# Patient Record
Sex: Male | Born: 1960 | Race: White | Hispanic: No | Marital: Single | Smoking: Current every day smoker
Health system: Southern US, Community
[De-identification: ages and names within clinical notes are randomized; demographics above are authoritative.]

## PROBLEM LIST (undated history)

## (undated) DIAGNOSIS — Z9889 Other specified postprocedural states: Secondary | ICD-10-CM

## (undated) DIAGNOSIS — F79 Unspecified intellectual disabilities: Secondary | ICD-10-CM

## (undated) DIAGNOSIS — I10 Essential (primary) hypertension: Secondary | ICD-10-CM

## (undated) DIAGNOSIS — E785 Hyperlipidemia, unspecified: Secondary | ICD-10-CM

## (undated) DIAGNOSIS — K5909 Other constipation: Secondary | ICD-10-CM

## (undated) DIAGNOSIS — Z789 Other specified health status: Secondary | ICD-10-CM

## (undated) HISTORY — DX: Unspecified intellectual disabilities: F79

## (undated) HISTORY — DX: Hyperlipidemia, unspecified: E78.5

## (undated) HISTORY — DX: Other constipation: K59.09

## (undated) HISTORY — DX: Essential (primary) hypertension: I10

## (undated) HISTORY — DX: Other specified postprocedural states: Z98.890

---

## 1997-10-25 DIAGNOSIS — Z9889 Other specified postprocedural states: Secondary | ICD-10-CM

## 1997-10-25 HISTORY — DX: Other specified postprocedural states: Z98.890

## 2003-11-22 ENCOUNTER — Encounter: Admission: RE | Admit: 2003-11-22 | Discharge: 2003-11-22 | Payer: Self-pay | Admitting: Internal Medicine

## 2006-04-01 ENCOUNTER — Ambulatory Visit: Payer: Self-pay | Admitting: Internal Medicine

## 2008-04-23 ENCOUNTER — Ambulatory Visit: Payer: Self-pay | Admitting: Internal Medicine

## 2009-06-09 ENCOUNTER — Encounter (INDEPENDENT_AMBULATORY_CARE_PROVIDER_SITE_OTHER): Payer: Self-pay | Admitting: *Deleted

## 2009-08-05 DIAGNOSIS — I10 Essential (primary) hypertension: Secondary | ICD-10-CM | POA: Insufficient documentation

## 2009-08-05 DIAGNOSIS — E7800 Pure hypercholesterolemia, unspecified: Secondary | ICD-10-CM

## 2009-08-05 DIAGNOSIS — F79 Unspecified intellectual disabilities: Secondary | ICD-10-CM

## 2009-08-05 DIAGNOSIS — K5909 Other constipation: Secondary | ICD-10-CM | POA: Insufficient documentation

## 2009-08-08 ENCOUNTER — Ambulatory Visit: Payer: Self-pay | Admitting: Gastroenterology

## 2009-08-09 ENCOUNTER — Encounter: Payer: Self-pay | Admitting: Internal Medicine

## 2009-08-23 ENCOUNTER — Encounter: Payer: Self-pay | Admitting: Gastroenterology

## 2009-09-02 ENCOUNTER — Ambulatory Visit: Payer: Self-pay | Admitting: Internal Medicine

## 2009-09-02 ENCOUNTER — Ambulatory Visit (HOSPITAL_COMMUNITY): Admission: RE | Admit: 2009-09-02 | Discharge: 2009-09-02 | Payer: Self-pay | Admitting: Internal Medicine

## 2009-09-02 HISTORY — PX: COLONOSCOPY: SHX174

## 2009-09-07 ENCOUNTER — Encounter: Payer: Self-pay | Admitting: Internal Medicine

## 2010-08-10 ENCOUNTER — Encounter (INDEPENDENT_AMBULATORY_CARE_PROVIDER_SITE_OTHER): Payer: Self-pay | Admitting: *Deleted

## 2010-09-26 NOTE — Letter (Signed)
Summary: Patient Notice, Colon Biopsy Results  [facility]  [address]   [place], [place] [address]   Phone: [phone]  Fax: [phone]       September 07, 2009   Brandon Wilcox [address] [place], [place]  [address] [birth year]    Dear Mr. Scarpati,  I am pleased to inform you that the biopsies taken during your recent colonoscopy did not show any evidence of cancer upon pathologic examination.  Additional information/recommendations:  You should have a repeat colonoscopy examination  in 5 years.  Please call us if you are having persistent problems or have questions about your condition that have not been fully answered at this time.  Sincerely,    R. Roetta Sessions MD  [facility] Ph: [phone]    Fax: [phone]   Appended Document: Patient Notice, Colon Biopsy Results mailed letter to pt

## 2010-09-28 NOTE — Letter (Signed)
Summary: Recall Office Visit  [facility]  [address]   [place], [place] [address]   Phone: [phone]  Fax: [phone]      August 10, 2010   Brandon Wilcox [address] [place], [place]  [address] 1960/10/19   Dear [name],   According to our records, it is time for you to schedule a follow-up office visit with [place].   At your convenience, please call [phone] to schedule an office visit. If you have any questions, concerns, or feel that this letter is in error, we would appreciate your call.   Sincerely,    Diana Eves  [facility] Ph: [phone]   Fax: [phone]

## 2010-10-04 ENCOUNTER — Ambulatory Visit: Payer: Self-pay | Admitting: Gastroenterology

## 2010-10-23 ENCOUNTER — Encounter: Payer: Self-pay | Admitting: Gastroenterology

## 2010-10-23 ENCOUNTER — Ambulatory Visit (INDEPENDENT_AMBULATORY_CARE_PROVIDER_SITE_OTHER): Payer: Medicare Other | Admitting: Gastroenterology

## 2010-10-23 DIAGNOSIS — K5909 Other constipation: Secondary | ICD-10-CM

## 2010-11-02 NOTE — Assessment & Plan Note (Signed)
Summary: yearly follow up   Vital Signs:  Patient profile:   50 year old male Height:      71 inches Weight:      224 pounds BMI:     31.35 Temp:     98.1 degrees F oral Pulse rate:   72 / minute BP sitting:   120 / 82  (left arm)  Vitals Entered By: Carolan Clines LPN (October 23, 2010 9:34 AM)  Visit Type:  Follow-up Visit Primary Care Provider:  Virgina Organ   History of Present Illness: [name] is a 50 year old Caucasian male, mentally challenged, here with caregiver today. Has hx of chronic constipation. Last colonoscopy in Jan 2011, tubular ademona. Due for repeat in 2015. Denies abdominal pain. Caregiver unsure how often has a BM. No melena or hematochezia. Denies nausea. Caregiver reports good appetite. Wt 224 today, up about 6 lbs from a visit one year ago.   Current Medications (verified): 1)  Folic Acid 1 Mg Tabs (Folic Acid) .... Once Daily 2)  Seroquel Xr 300 Mg Xr24h-Tab (Quetiapine Fumarate) .... Take 2 At Bedtime 3)  Certagen  Tabs (Multiple Vitamins-Minerals) .... Once Daily 4)  Niaspan 500 Mg Cr-Tabs (Niacin (Antihyperlipidemic)) .... Take 3 At Bedtime 5)  Propranolol Hcl 40 Mg Tabs (Propranolol Hcl) .... Four Times A Day 6)  Lactulose 10 Gm/71ml Soln (Lactulose) .... 30 Cc Two Times A Day 7)  Selenium 2.5% Lotion/ Shampoo .... Twice Weekly 8)  Calcium Antacid Tablet .... Take 1 Tablet By Mouth Two Times A Day 9)  Lamictal 100 Mg Tabs (Lamotrigine) .... Take 1 Tablet By Mouth Two Times A Day 10)  Naftin 1 % Gel (Naftifine Hcl) .... Apply To Feet and Inbetween Toes  Once Daily X 4 Weeks 11)  Polyethylene Glycol 3350  Powd (Polyethylene Glycol 3350) .Marland KitchenMarland KitchenMarland Kitchen 17 Grams By Mouth Daily  Allergies: No Known Drug Allergies  Past History:  Past Medical History: Last updated: 08/08/2009 Hyperlipidemia Hypertension Chronic constipation Mental retardation Flex sig, 3/99, normal to 60cm.  ACBE normal.  Past Surgical History: Last updated: 08/05/2009 NONE  Family  History: Last updated: 08/08/2009 No known family history of colon cancer.  Social History: Last updated: 08/08/2009 Single. No children. Resident of [facility]. Patient currently smokes.  Alcohol Use - no  Review of Systems       unable to obtain complete report from pt. hx mentally challenged. However, denies abdominal pain, N/V.   Physical Exam  General:  Well developed, well nourished, no acute distress. Head:  Normocephalic and atraumatic. Eyes:  sclera without icterus Lungs:  Clear throughout to auscultation. Heart:  Regular rate and rhythm; no murmurs, rubs,  or bruits. Abdomen:  +BS, obese, soft, non-tender, non-distended. Pt very ticklish.  Msk:  Symmetrical with no gross deformities. Normal posture. Extremities:  No clubbing, cyanosis, edema or deformities noted. Neurologic:  alert to person. hx mentally challenged   Impression & Recommendations:  Problem # 1:  CONSTIPATION, CHRONIC (ICD-41.9)  50 year old Caucasian male, resident of [facility], with hx of chronic constipation. Denies abd pain, N/V. no brbpr. No loss of appetite, wt actually up about 6 lbs from last visit. Due for repeat colonoscopy in 2015. Taking lactulose 30 mg twice/daily and miralax daily.  Continue lactulose and miralax Contact group home to find out how often having BM Return in 1 year colonoscopy in 2015.   Orders: Est. Patient Level II (57[address])   Orders Added: 1)  Est. Patient Level II [[address]]  Appended Document:  yearly follow up can we contact [facility] at [phone] and find out how often pt has BM? caregiver unsure today.  Appended Document: yearly follow up 1 YR F/U OPV IS IN THE COMPUTER  Appended Document: yearly follow up called [facility] got voicemail- left message  Appended Document: yearly follow up [facility]- she stated pt will usually go 2-3 days before he has bm and sometimes they have to give him MOM or prune juice to help  him go. In the past when he lived at home- his father told him when he could go to the bathroom so pt learned to hold it for days.

## 2011-01-09 NOTE — Assessment & Plan Note (Signed)
NAMEMarland Kitchen  Brandon, Wilcox             CHART#:  [id number]   DATE:  04/23/2008                       DOB:  1960/12/12   FOLLOWUP:  Constipation, last seen on 04/01/2006.  The patient is a  mentally challenged pleasant gentleman, resident of [facility].  He was last seen here 2 years ago.  He has chronic constipation.  He has  done very well on lactulose 30 mL b.i.d.  Generally, he has a bowel  movement daily.  His caregiver accompanies him today states that he on a  rare occasion, needs milk of magnesia (i.e. on the order of 2 times  every 6 months to facilitate bowel function.  He does not have any  melena or rectal bleeding.  No abdominal pain or upper GI tract  symptoms.  No odynophagia, dysphagia, or nausea or vomiting.  He has  lost 19 pounds since he was last seen in 2007.  He is followed primarily  by Dr. Dyann Ruddle.   CURRENT MEDICATIONS:  See updated list.   ALLERGIES:  No known drug allergies.   PHYSICAL EXAMINATION:  VITAL SIGNS:  Today, he appears at his baseline.  Weight 200, height 6 feet, temperature 97.8, BP 102/68, and pulse 64.  SKIN:  Warm and dry.  CHEST:  Lungs are clear to auscultation.  CARDIAC:  Regular rate and rhythm without murmur, gallop, or rub.  ABDOMEN:  Nondistended, positive bowel sounds, entirely soft, and  nontender without appreciable mass or hepatosplenomegaly.   ASSESSMENT:  Chronic constipation well managed with lactulose.  He is  tolerating this regimen very well.  He will be due for his first ever  screening colonoscopy.  He is in average risk population in 1 year.  We  will plan to see this gentleman back here in the office in 1 year and  set him for screening colonoscopy.       Brandon Wilcox, M.D.  Electronically Signed     RMR/MEDQ  D:  04/23/2008  T:  04/23/2008  Job:  [id number]   cc:   [place]

## 2012-03-27 ENCOUNTER — Encounter (HOSPITAL_COMMUNITY): Payer: Self-pay

## 2012-03-27 ENCOUNTER — Emergency Department (HOSPITAL_COMMUNITY)
Admission: EM | Admit: 2012-03-27 | Discharge: 2012-03-27 | Disposition: A | Discharge status: Home / Self Care | Payer: Medicare Other | Attending: Emergency Medicine | Admitting: Emergency Medicine

## 2012-03-27 ENCOUNTER — Emergency Department (HOSPITAL_COMMUNITY): Payer: Medicare Other

## 2012-03-27 DIAGNOSIS — E785 Hyperlipidemia, unspecified: Secondary | ICD-10-CM | POA: Insufficient documentation

## 2012-03-27 DIAGNOSIS — J40 Bronchitis, not specified as acute or chronic: Secondary | ICD-10-CM | POA: Insufficient documentation

## 2012-03-27 DIAGNOSIS — J069 Acute upper respiratory infection, unspecified: Secondary | ICD-10-CM | POA: Insufficient documentation

## 2012-03-27 DIAGNOSIS — F79 Unspecified intellectual disabilities: Secondary | ICD-10-CM | POA: Insufficient documentation

## 2012-03-27 DIAGNOSIS — I10 Essential (primary) hypertension: Secondary | ICD-10-CM | POA: Insufficient documentation

## 2012-03-27 DIAGNOSIS — F17200 Nicotine dependence, unspecified, uncomplicated: Secondary | ICD-10-CM | POA: Insufficient documentation

## 2012-03-27 MED ORDER — HYDROCOD POLST-CHLORPHEN POLST 10-8 MG/5ML PO LQCR
5.0000 mL | Freq: Once | ORAL | Status: AC
  Administered 2012-03-27: 5 mL via ORAL
  Filled 2012-03-27: qty 5

## 2012-03-27 MED ORDER — AZITHROMYCIN 250 MG PO TABS: ORAL_TABLET | ORAL | Status: DC

## 2012-03-27 MED ORDER — DOXYCYCLINE HYCLATE 100 MG PO TABS
100.0000 mg | ORAL_TABLET | Freq: Once | ORAL | Status: AC
  Administered 2012-03-27: 100 mg via ORAL
  Filled 2012-03-27: qty 1

## 2012-03-27 MED ORDER — HYDROCOD POLST-CHLORPHEN POLST 10-8 MG/5ML PO LQCR: 5.0000 mL | Freq: Two times a day (BID) | ORAL | Status: DC | PRN

## 2012-03-27 NOTE — ED Provider Notes (Signed)
History     CSN: [id number]  Arrival date & time 03/27/12  1016   First MD Initiated Contact with Patient 03/27/12 1026      Chief Complaint  Patient presents with  . Cough    (Consider location/radiation/quality/duration/timing/severity/associated sxs/prior treatment) Patient is a 51 y.o. male presenting with cough. The history is provided by a caregiver.  Cough This is a new problem. The current episode started more than 2 days ago. The problem occurs every few minutes. The problem has been gradually worsening. The cough is non-productive. There has been no fever. Associated symptoms include rhinorrhea. Pertinent negatives include no chest pain, no shortness of breath and no wheezing. He has tried cough syrup for the symptoms. The treatment provided no relief. He is a smoker. His past medical history is significant for bronchitis. His past medical history does not include pneumonia. Past medical history comments: Mental retardation.    Past Medical History  Diagnosis Date  . Hyperlipidemia   . Hypertension   . Chronic constipation   . Mental retardation   . History of flexible sigmoidoscopy 3/99    Normal  to 60 cm ACBE normal    History reviewed. No pertinent past surgical history.  No family history on file.  History  Substance Use Topics  . Smoking status: Current Everyday Smoker  . Smokeless tobacco: Not on file  . Alcohol Use: No      Review of Systems  Constitutional: Negative for activity change.       All ROS Neg except as noted in HPI  HENT: Positive for rhinorrhea. Negative for nosebleeds and neck pain.   Eyes: Negative for photophobia and discharge.  Respiratory: Positive for cough. Negative for shortness of breath and wheezing.   Cardiovascular: Negative for chest pain and palpitations.  Gastrointestinal: Negative for abdominal pain and blood in stool.  Genitourinary: Negative for dysuria, frequency and hematuria.  Musculoskeletal: Negative for back  pain and arthralgias.  Skin: Negative.   Neurological: Negative for dizziness, seizures and speech difficulty.  Psychiatric/Behavioral: Negative for hallucinations and confusion.    Allergies  Review of patient's allergies indicates no known allergies.  Home Medications   Current Outpatient Rx  Name Route Sig Dispense Refill  . NIACIN ER (ANTIHYPERLIPIDEMIC) 500 MG PO TBCR Oral Take 1,500 mg by mouth at bedtime.    Marland Kitchen POLYETHYLENE GLYCOL 3350 PO PACK Oral Take 17 g by mouth daily.    . AZITHROMYCIN 250 MG PO TABS  2 tablets on 8/2, then 1 po daily until all taken. 6 tablet 0  . CALCIUM ANTACID PO Oral Take by mouth.      Marland Kitchen HYDROCOD POLST-CPM POLST ER 10-8 MG/5ML PO LQCR Oral Take 5 mLs by mouth every 12 (twelve) hours as needed. 100 mL 0  . FOLIC ACID 1 MG PO TABS Oral Take 1 mg by mouth daily.      Marland Kitchen LACTULOSE 10 GM/15ML PO SOLN Oral Take 20 g by mouth 2 (two) times daily.     Marland Kitchen LAMOTRIGINE 100 MG PO TABS Oral Take 100 mg by mouth 2 (two) times daily.     . CERTAGEN PO Oral Take by mouth.      Marland Kitchen PROPRANOLOL HCL 40 MG PO TABS Oral Take 40 mg by mouth 4 (four) times daily.     . QUETIAPINE FUMARATE ER 300 MG PO TB24 Oral Take 600 mg by mouth at bedtime.     . SELENIUM SULFIDE 2.5 % EX LOTN Topical Apply 1  application topically daily as needed. itching      BP 121/83  Pulse 66  Temp 97.8 F (36.6 C) (Oral)  Resp 18  Ht 6' (1.829 m)  Wt 210 lb (95.255 kg)  BMI 28.48 kg/m2  SpO2 98%  Physical Exam  Nursing note and vitals reviewed. Constitutional: He is oriented to person, place, and time. He appears well-developed and well-nourished.  Non-toxic appearance.  HENT:  Head: Normocephalic.  Right Ear: Tympanic membrane and external ear normal.  Left Ear: Tympanic membrane and external ear normal.  Eyes: EOM and lids are normal. Pupils are equal, round, and reactive to light.  Neck: Normal range of motion. Neck supple. Carotid bruit is not present.  Cardiovascular: Normal rate,  regular rhythm, normal heart sounds, intact distal pulses and normal pulses.   Pulmonary/Chest: No respiratory distress. He has rhonchi.  Abdominal: Soft. Bowel sounds are normal. There is no tenderness. There is no guarding.  Musculoskeletal: Normal range of motion.  Lymphadenopathy:       Head (right side): No submandibular adenopathy present.       Head (left side): No submandibular adenopathy present.    He has no cervical adenopathy.  Neurological: He is alert and oriented to person, place, and time. He has normal strength. No cranial nerve deficit or sensory deficit.  Skin: Skin is warm and dry.  Psychiatric: He has a normal mood and affect. Cognition and memory are impaired.    ED Course  Procedures (including critical care time)  Labs Reviewed - No data to display Dg Chest 2 View  03/27/2012  *RADIOLOGY REPORT*  Clinical Data: Cough and hypertension  CHEST - 2 VIEW  Comparison: None.  Findings: The heart, mediastinal, and hilar contours are normal. Normal pulmonary vascularity.  The lungs are clear.  No visible airspace disease, pleural effusion, or pneumothorax.  Bones appear normal for age.  IMPRESSION: No acute cardiopulmonary disease.  Original Report Authenticated By: Britta Mccreedy, M.D.     1. Bronchitis   2. URI (upper respiratory infection)       MDM  I have reviewed nursing notes, vital signs, and all appropriate lab and imaging results for this patient. The chest xray is negative for acute problem. Rx for z-pack and Tussionex q12h for cough and congestion given. Pt to follow up with PCP next week.       Kathie Dike, [place] 03/27/12 1136

## 2012-03-27 NOTE — ED Notes (Signed)
Pt c/o cough x 3 or 4 days.  Pt resident at [facility].

## 2012-03-27 NOTE — ED Provider Notes (Signed)
Medical screening examination/treatment/procedure(s) were performed by non-physician practitioner and as supervising physician I was immediately available for consultation/collaboration.  Flint Melter, MD 03/27/12 [phone]

## 2012-05-20 ENCOUNTER — Ambulatory Visit: Payer: Medicare Other | Attending: Internal Medicine | Admitting: Occupational Therapy

## 2012-05-20 DIAGNOSIS — F79 Unspecified intellectual disabilities: Secondary | ICD-10-CM | POA: Insufficient documentation

## 2012-05-20 DIAGNOSIS — IMO0001 Reserved for inherently not codable concepts without codable children: Secondary | ICD-10-CM | POA: Insufficient documentation

## 2013-04-27 IMAGING — CR DG CHEST 2V
2 series · 2 of 2 positions shown · non-contrast
Comparison: None.

CLINICAL DATA: Cough and hypertension

CHEST - 2 VIEW

[view not recorded (1 of 2)]
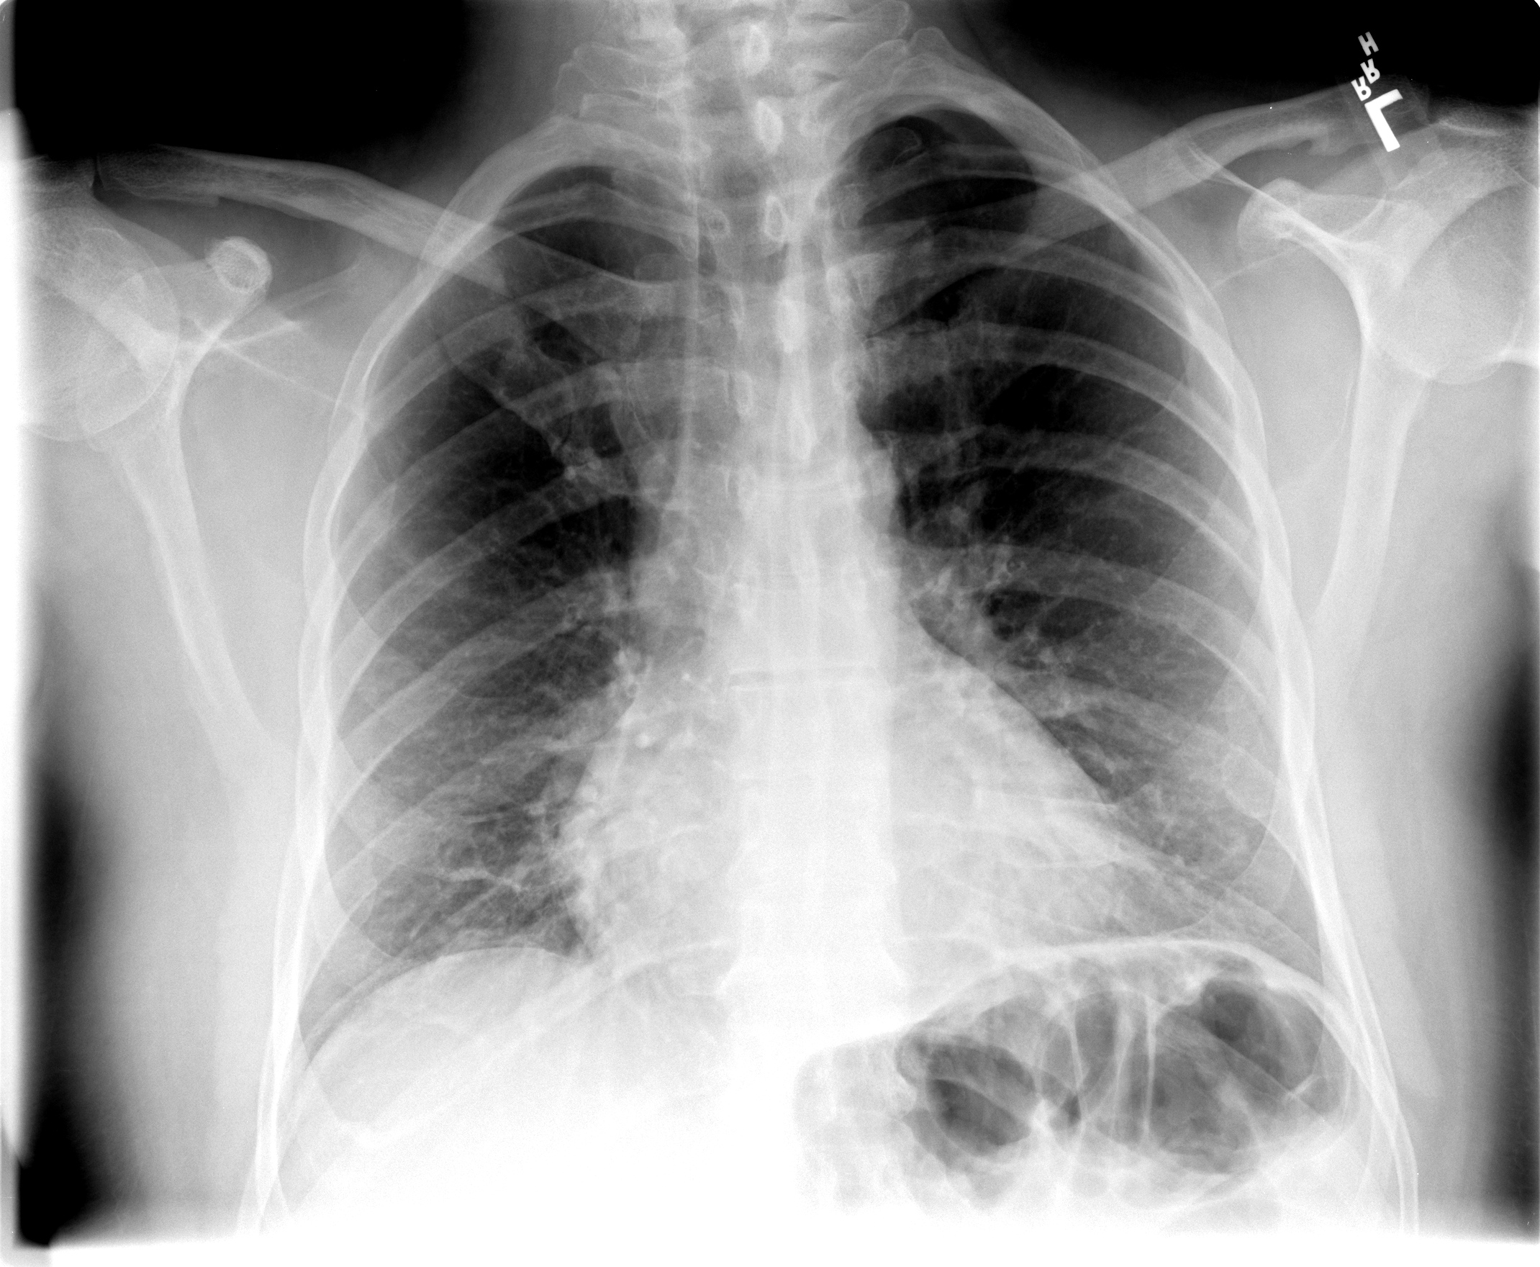

[view not recorded (2 of 2)]
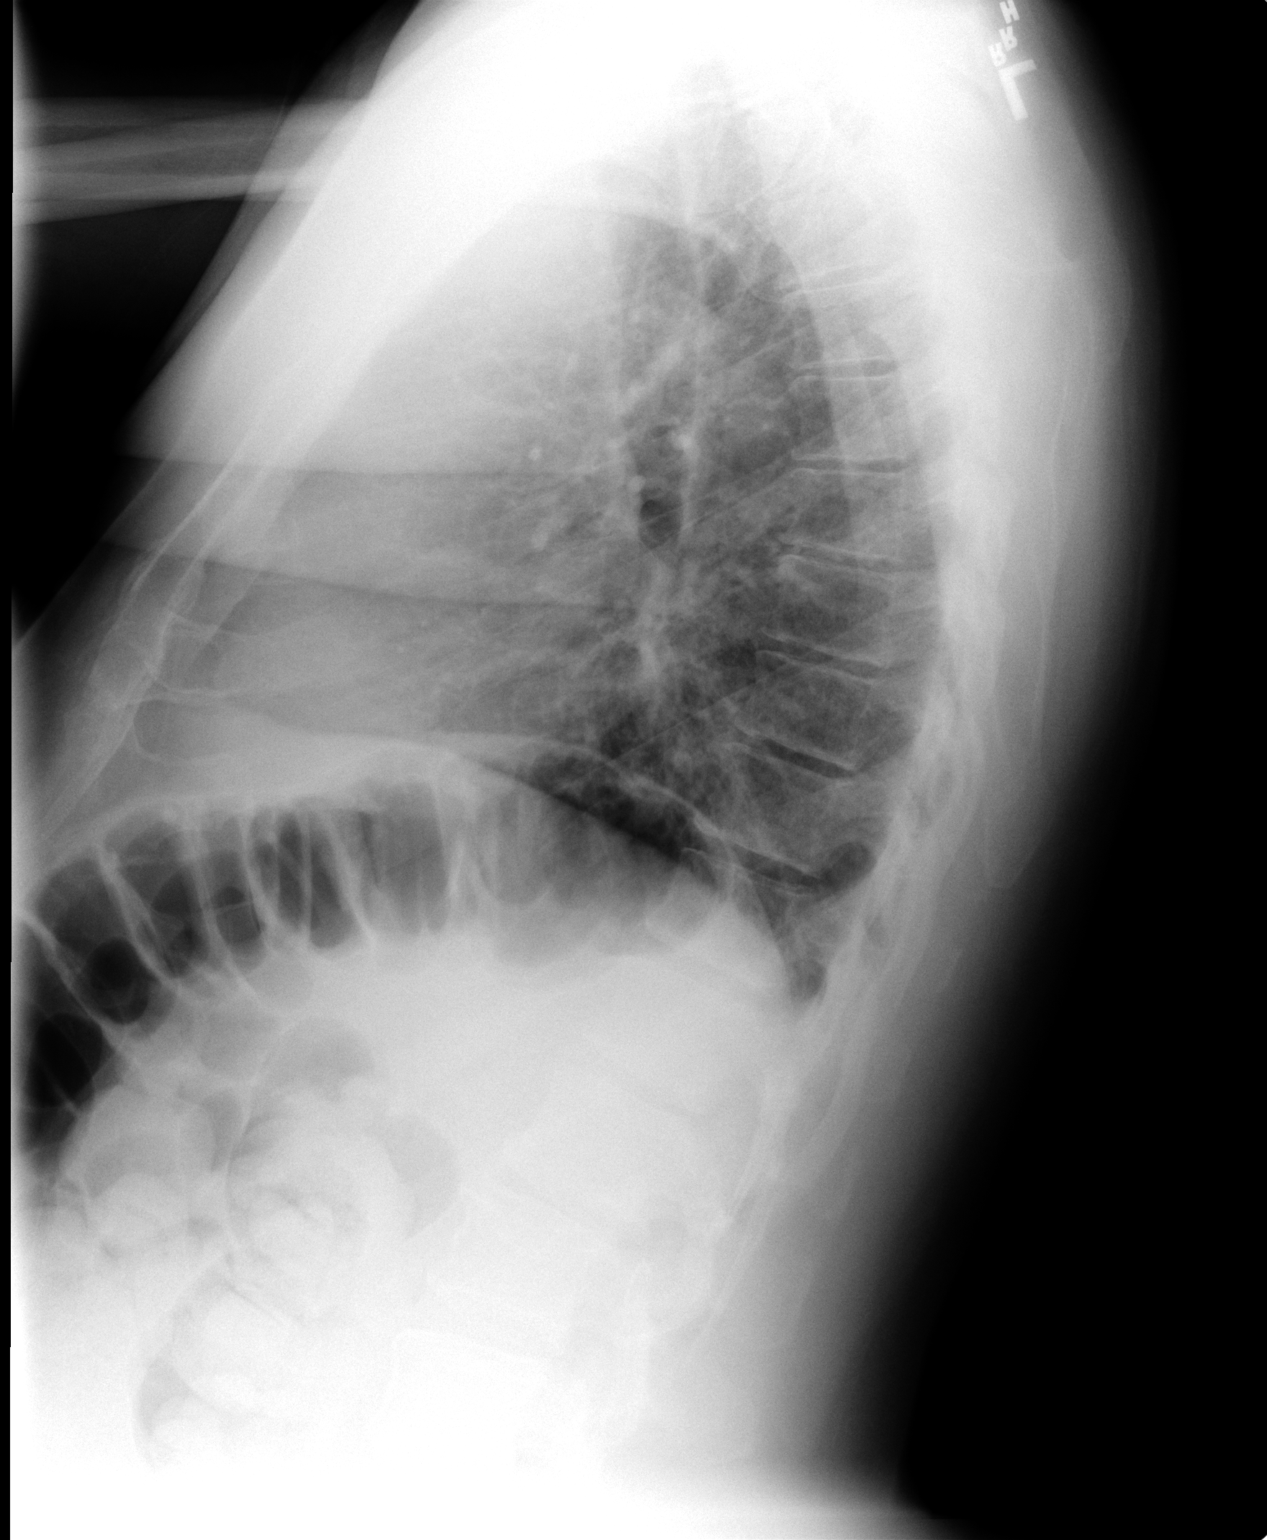

[2 of 2 positions shown; findings below may reference images not displayed]

FINDINGS: The heart, mediastinal, and hilar contours are normal.
Normal pulmonary vascularity.  The lungs are clear.  No visible
airspace disease, pleural effusion, or pneumothorax.  Bones appear
normal for age.
IMPRESSION: No acute cardiopulmonary disease.

## 2014-08-23 ENCOUNTER — Encounter: Payer: Self-pay | Admitting: Internal Medicine

## 2014-10-07 ENCOUNTER — Ambulatory Visit (INDEPENDENT_AMBULATORY_CARE_PROVIDER_SITE_OTHER): Payer: Medicare Other | Admitting: Gastroenterology

## 2014-10-07 ENCOUNTER — Telehealth: Payer: Self-pay | Admitting: Internal Medicine

## 2014-10-07 ENCOUNTER — Encounter: Payer: Self-pay | Admitting: Gastroenterology

## 2014-10-07 VITALS — BP 128/78 | HR 53 | Temp 97.6°F | Ht 72.0 in | Wt 176.2 lb

## 2014-10-07 DIAGNOSIS — Z86010 Personal history of colonic polyps: Secondary | ICD-10-CM

## 2014-10-07 NOTE — Progress Notes (Signed)
Primary Care Physician:  Cleda Mccreedy, MD  Primary GI: Dr. Gala Romney   Chief Complaint  Patient presents with  . Follow-up    HPI:   Brandon Wilcox is a 54 y.o. male, mentally challenged,  presenting today with a history of adenomatous colon polyps and due for surveillance. Last colonoscopy in 2011. Resident at [facility]. Caregiver states doing well. No reports of blood. Appetite good. No dysphagia. On lactulose BID and Miralax once daily. Working well for him. Unable to obtain history due to cognitive status.   Past Medical History  Diagnosis Date  . Hyperlipidemia   . Hypertension   . Chronic constipation   . Mental retardation   . History of flexible sigmoidoscopy 3/99    Normal  to 60 cm ACBE normal    Past Surgical History  Procedure Laterality Date  . Colonoscopy  09/02/2009    RMR: 1. Normal rectum 2. Tortuous colon. 3. Hepatic flexure polyp, status post cold snare polypectomy. 4. the remainder colonic mucosa appeared normal, although a poor preparation compromised the examination. +tubular adenoma    Current Outpatient Prescriptions  Medication Sig Dispense Refill  . Calcium Carbonate Antacid (CALCIUM ANTACID PO) Take by mouth.      . folic acid (FOLVITE) 1 MG tablet Take 1 mg by mouth daily.      Marland Kitchen lactulose (CHRONULAC) 10 GM/15ML solution Take 20 g by mouth 2 (two) times daily.     Marland Kitchen lamoTRIgine (LAMICTAL) 100 MG tablet Take 100 mg by mouth 2 (two) times daily.     . Multiple Vitamins-Minerals (CERTAGEN PO) Take by mouth.      . niacin (NIASPAN) 500 MG CR tablet Take 1,500 mg by mouth at bedtime.    . polyethylene glycol (MIRALAX / GLYCOLAX) packet Take 17 g by mouth daily.    . propranolol (INDERAL) 40 MG tablet Take 40 mg by mouth 4 (four) times daily.     . QUEtiapine (SEROQUEL XR) 300 MG 24 hr tablet Take 600 mg by mouth at bedtime.     . selenium sulfide (SELSUN) 2.5 % shampoo Apply 1 application topically daily as needed. itching    .  azithromycin (ZITHROMAX Z-PAK) 250 MG tablet 2 tablets on 8/2, then 1 po daily until all taken. (Patient not taking: Reported on 10/07/2014) 6 tablet 0  . chlorpheniramine-HYDROcodone (TUSSIONEX PENNKINETIC ER) 10-8 MG/5ML LQCR Take 5 mLs by mouth every 12 (twelve) hours as needed. (Patient not taking: Reported on 10/07/2014) 100 mL 0   No current facility-administered medications for this visit.    Allergies as of 10/07/2014  . (No Known Allergies)    Family History: Unknown   History   Social History  . Marital Status: Single    Spouse Name: N/A  . Number of Children: N/A  . Years of Education: N/A   Social History Main Topics  . Smoking status: Current Every Day Smoker  . Smokeless tobacco: Not on file  . Alcohol Use: No  . Drug Use: No  . Sexual Activity: Not on file   Other Topics Concern  . None   Social History Narrative    Review of Systems: Unable to obtain   Physical Exam: BP 128/78 mmHg  Pulse 53  Temp(Src) 97.6 F (36.4 C) (Oral)  Ht 6' (1.829 m)  Wt 176 lb 3.2 oz (79.924 kg)  BMI 23.89 kg/m2 General:   Alert, unable to assess orientation. Pleasant.  Head:  Normocephalic and atraumatic. Eyes:  Conjuctiva  clear without scleral icterus. Mouth:  Oral mucosa pink and moist.  Heart:  S1, S2 present without murmurs, rubs, or gallops. Regular rate and rhythm. Abdomen:  +BS, soft, non-tender and non-distended. No rebound or guarding. No HSM or masses noted. Msk:  Symmetrical without gross deformities. Normal posture. Extremities:  Without edema. Neurologic:  Alert and unable to assess orientation Skin:  Intact without significant lesions or rashes. Psych:  Alert and cooperative. Normal mood and affect.

## 2014-10-07 NOTE — Telephone Encounter (Signed)
Pt was seen by AS this morning. The group home called back to give [place] his guardian's name and number. Guardian is Fidela Juneau [phone]

## 2014-10-07 NOTE — Patient Instructions (Signed)
We have scheduled you for a colonoscopy with Dr. Gala Romney.  I would like you to have 2 days of clear liquids before the procedure to make sure you are cleaned out!

## 2014-10-08 ENCOUNTER — Other Ambulatory Visit: Payer: Self-pay

## 2014-10-08 ENCOUNTER — Telehealth: Payer: Self-pay

## 2014-10-08 DIAGNOSIS — Z86010 Personal history of colonic polyps: Secondary | ICD-10-CM

## 2014-10-08 MED ORDER — BISACODYL 5 MG PO TBEC: 5.0000 mg | DELAYED_RELEASE_TABLET | Freq: Every day | ORAL | Status: AC | PRN

## 2014-10-08 MED ORDER — PHOSPHATE LAXATIVE 2.7-7.2 GM/15ML PO SOLN: 15.0000 mL | Freq: Once | ORAL | Status: AC

## 2014-10-08 MED ORDER — PEG 3350-KCL-NA BICARB-NACL 420 G PO SOLR: 4000.0000 mL | Freq: Once | ORAL | Status: AC

## 2014-10-08 NOTE — Telephone Encounter (Signed)
Noted. Guardianship papers have been received. Consent will be obtained via phone call in Endo on the day of procedure

## 2014-10-08 NOTE — Telephone Encounter (Signed)
Spoke with Cassandra at [employer].  States she will be faxing guardianship papers to our office.  States that on the day of the procedure for the hospital to call the crisis line number to obtain consent from whom is on call.    Called Melanie at [facility] and verified what was need for pts with guardianship.  States that on the day of the procedure the pts consent will confirmed via phone call guardianship office and verified with 2 nurses.   Will fax pt guardian papers to [facility]  When received by [facility].

## 2014-10-08 NOTE — Telephone Encounter (Signed)
Received guardianship papers from Hondo at [facility].  Papers were scanned into pt chart and faxed to Endo. Pt is scheduled for 10/27/2014 @ 930am.

## 2014-10-08 NOTE — Telephone Encounter (Signed)
Noted. Ginger [employer] Candy, please make sure patient is consented by guardian.

## 2014-10-11 NOTE — Assessment & Plan Note (Signed)
54 year old male with history of adenomatous polyps, due for surveillance now. Mentally challenged, but caretaker denies any concerning lower or upper GI symptoms. Needs colonoscopy in the near future. Guardian to give verbal consent on day of procedure, verified with endoscopy (see phone notes).   Proceed with TCS with Dr. Gala Romney in near future: the risks, benefits, and alternatives have been discussed with the patient in detail. The patient states understanding and desires to proceed. 2 days of clear liquids prior due to poor prep in the past

## 2014-10-18 ENCOUNTER — Telehealth: Payer: Self-pay

## 2014-10-18 NOTE — Telephone Encounter (Signed)
[facility] called about patient. They are needing his prep instructions faxed to them. The printer isn't working for me out front or I would do it. Please fax to [phone] He is scheduled for 10/27/2014

## 2014-10-18 NOTE — Telephone Encounter (Signed)
Noted and faxed.  

## 2014-10-19 NOTE — Progress Notes (Signed)
CC'ED TO PCP 

## 2014-10-27 ENCOUNTER — Encounter (HOSPITAL_COMMUNITY): Payer: Self-pay | Admitting: *Deleted

## 2014-10-27 ENCOUNTER — Ambulatory Visit (HOSPITAL_COMMUNITY)
Admission: RE | Admit: 2014-10-27 | Discharge: 2014-10-27 | Disposition: A | Discharge status: Home / Self Care | Payer: Medicare Other | Source: Ambulatory Visit | Attending: Internal Medicine | Admitting: Internal Medicine

## 2014-10-27 ENCOUNTER — Encounter (HOSPITAL_COMMUNITY)
Admission: RE | Disposition: A | Discharge status: Home / Self Care | Payer: Self-pay | Source: Ambulatory Visit | Attending: Internal Medicine

## 2014-10-27 DIAGNOSIS — F79 Unspecified intellectual disabilities: Secondary | ICD-10-CM | POA: Insufficient documentation

## 2014-10-27 DIAGNOSIS — F17210 Nicotine dependence, cigarettes, uncomplicated: Secondary | ICD-10-CM | POA: Insufficient documentation

## 2014-10-27 DIAGNOSIS — I10 Essential (primary) hypertension: Secondary | ICD-10-CM | POA: Insufficient documentation

## 2014-10-27 DIAGNOSIS — E785 Hyperlipidemia, unspecified: Secondary | ICD-10-CM | POA: Diagnosis not present

## 2014-10-27 DIAGNOSIS — K635 Polyp of colon: Secondary | ICD-10-CM | POA: Diagnosis not present

## 2014-10-27 DIAGNOSIS — K6389 Other specified diseases of intestine: Secondary | ICD-10-CM | POA: Insufficient documentation

## 2014-10-27 DIAGNOSIS — K5900 Constipation, unspecified: Secondary | ICD-10-CM | POA: Insufficient documentation

## 2014-10-27 DIAGNOSIS — Z86010 Personal history of colonic polyps: Secondary | ICD-10-CM

## 2014-10-27 DIAGNOSIS — D123 Benign neoplasm of transverse colon: Secondary | ICD-10-CM

## 2014-10-27 HISTORY — DX: Other specified health status: Z78.9

## 2014-10-27 HISTORY — PX: COLONOSCOPY: SHX5424

## 2014-10-27 SURGERY — COLONOSCOPY
Anesthesia: Moderate Sedation

## 2014-10-27 MED ORDER — STERILE WATER FOR IRRIGATION IR SOLN
Status: DC | PRN
  Administered 2014-10-27: 09:00:00

## 2014-10-27 MED ORDER — MEPERIDINE HCL 100 MG/ML IJ SOLN
INTRAMUSCULAR | Status: AC
  Filled 2014-10-27: qty 2

## 2014-10-27 MED ORDER — MIDAZOLAM HCL 5 MG/5ML IJ SOLN
INTRAMUSCULAR | Status: AC
  Filled 2014-10-27: qty 10

## 2014-10-27 MED ORDER — ONDANSETRON HCL 4 MG/2ML IJ SOLN
INTRAMUSCULAR | Status: DC | PRN
  Administered 2014-10-27: 4 mg via INTRAVENOUS

## 2014-10-27 MED ORDER — MEPERIDINE HCL 100 MG/ML IJ SOLN
INTRAMUSCULAR | Status: DC | PRN
  Administered 2014-10-27: 50 mg via INTRAVENOUS

## 2014-10-27 MED ORDER — MIDAZOLAM HCL 5 MG/5ML IJ SOLN
INTRAMUSCULAR | Status: DC | PRN
  Administered 2014-10-27: 2 mg via INTRAVENOUS

## 2014-10-27 MED ORDER — ONDANSETRON HCL 4 MG/2ML IJ SOLN
INTRAMUSCULAR | Status: AC
  Filled 2014-10-27: qty 2

## 2014-10-27 MED ORDER — SODIUM CHLORIDE 0.9 % IV SOLN
INTRAVENOUS | Status: DC
  Administered 2014-10-27: 1000 mL via INTRAVENOUS

## 2014-10-27 NOTE — Op Note (Signed)
[facility] [address] [place], [address]   COLONOSCOPY PROCEDURE REPORT  PATIENT: Wilcox, Brandon  MR#: [id number] BIRTHDATE: 05-30-1961 , 89  yrs. old GENDER: male ENDOSCOPIST: R.  Garfield Cornea, MD FACP [facility] REFERRED WL:SLHTD Qureshi, M.D. PROCEDURE DATE:  2014/10/30 PROCEDURE:   Colonoscopy with biopsy INDICATIONS:History of colonic adenoma. MEDICATIONS: Versed 2 mg IV and Demerol 50 mg IV in divided doses. Zofran 4 mg IV. ASA CLASS:       Class II  CONSENT: The risks, benefits, alternatives and imponderables including but not limited to bleeding, perforation as well as the possibility of a missed lesion have been reviewed.  The potential for biopsy, lesion removal, etc. have also been discussed. Questions have been answered.  All parties agreeable.  Please see the history and physical in the medical record for more information.  DESCRIPTION OF PROCEDURE:   After the risks benefits and alternatives of the procedure were thoroughly explained, informed consent was obtained.  The digital rectal exam revealed no abnormalities of the rectum.   The [id number] ([id number])  endoscope was introduced through the anus and advanced to the cecum, which was identified by both the appendix and ileocecal valve. No adverse events experienced.   The quality of the prep was adequate  The instrument was then slowly withdrawn as the colon was fully examined.      COLON FINDINGS: Normal rectum.  Redundant, capacious colon.  (1) 3 mm polyp at the splenic flexure; otherwise, the remainder of the colonic mucosa appeared normal.  The above-mentioned polyp wascold biopsy/removed.  Retroflexed views revealed no abnormalities. .  Withdrawal time=10 minutes 0 seconds.  The scope was withdrawn and the procedure completed. COMPLICATIONS: There were no immediate complications.  ENDOSCOPIC IMPRESSION: Single colonic polyp?"removed as described above. Capacious  and redundant colon.  RECOMMENDATIONS: Follow-up on pathology.  eSigned:  R. Garfield Cornea, MD Rosalita Chessman [facility] 10/30/14 10:24 AM   cc:  CPT CODES: ICD CODES:  The ICD and CPT codes recommended by this software are interpretations from the data that the clinical staff has captured with the software.  The verification of the translation of this report to the ICD and CPT codes and modifiers is the sole responsibility of the health care institution and practicing physician where this report was generated.  [employer]. will not be held responsible for the validity of the ICD and CPT codes included on this report.  AMA assumes no liability for data contained or not contained herein. CPT is a Designer, television/film set of the [employer].

## 2014-10-27 NOTE — H&P (View-Only) (Signed)
Primary Care Physician:  Cleda Mccreedy, MD  Primary GI: Dr. Gala Romney   Chief Complaint  Patient presents with  . Follow-up    HPI:   Brandon Wilcox is a 54 y.o. male, mentally challenged,  presenting today with a history of adenomatous colon polyps and due for surveillance. Last colonoscopy in 2011. Resident at [facility]. Caregiver states doing well. No reports of blood. Appetite good. No dysphagia. On lactulose BID and Miralax once daily. Working well for him. Unable to obtain history due to cognitive status.   Past Medical History  Diagnosis Date  . Hyperlipidemia   . Hypertension   . Chronic constipation   . Mental retardation   . History of flexible sigmoidoscopy 3/99    Normal  to 60 cm ACBE normal    Past Surgical History  Procedure Laterality Date  . Colonoscopy  09/02/2009    RMR: 1. Normal rectum 2. Tortuous colon. 3. Hepatic flexure polyp, status post cold snare polypectomy. 4. the remainder colonic mucosa appeared normal, although a poor preparation compromised the examination. +tubular adenoma    Current Outpatient Prescriptions  Medication Sig Dispense Refill  . Calcium Carbonate Antacid (CALCIUM ANTACID PO) Take by mouth.      . folic acid (FOLVITE) 1 MG tablet Take 1 mg by mouth daily.      Marland Kitchen lactulose (CHRONULAC) 10 GM/15ML solution Take 20 g by mouth 2 (two) times daily.     Marland Kitchen lamoTRIgine (LAMICTAL) 100 MG tablet Take 100 mg by mouth 2 (two) times daily.     . Multiple Vitamins-Minerals (CERTAGEN PO) Take by mouth.      . niacin (NIASPAN) 500 MG CR tablet Take 1,500 mg by mouth at bedtime.    . polyethylene glycol (MIRALAX / GLYCOLAX) packet Take 17 g by mouth daily.    . propranolol (INDERAL) 40 MG tablet Take 40 mg by mouth 4 (four) times daily.     . QUEtiapine (SEROQUEL XR) 300 MG 24 hr tablet Take 600 mg by mouth at bedtime.     . selenium sulfide (SELSUN) 2.5 % shampoo Apply 1 application topically daily as needed. itching    .  azithromycin (ZITHROMAX Z-PAK) 250 MG tablet 2 tablets on 8/2, then 1 po daily until all taken. (Patient not taking: Reported on 10/07/2014) 6 tablet 0  . chlorpheniramine-HYDROcodone (TUSSIONEX PENNKINETIC ER) 10-8 MG/5ML LQCR Take 5 mLs by mouth every 12 (twelve) hours as needed. (Patient not taking: Reported on 10/07/2014) 100 mL 0   No current facility-administered medications for this visit.    Allergies as of 10/07/2014  . (No Known Allergies)    Family History: Unknown   History   Social History  . Marital Status: Single    Spouse Name: N/A  . Number of Children: N/A  . Years of Education: N/A   Social History Main Topics  . Smoking status: Current Every Day Smoker  . Smokeless tobacco: Not on file  . Alcohol Use: No  . Drug Use: No  . Sexual Activity: Not on file   Other Topics Concern  . None   Social History Narrative    Review of Systems: Unable to obtain   Physical Exam: BP 128/78 mmHg  Pulse 53  Temp(Src) 97.6 F (36.4 C) (Oral)  Ht 6' (1.829 m)  Wt 176 lb 3.2 oz (79.924 kg)  BMI 23.89 kg/m2 General:   Alert, unable to assess orientation. Pleasant.  Head:  Normocephalic and atraumatic. Eyes:  Conjuctiva  clear without scleral icterus. Mouth:  Oral mucosa pink and moist.  Heart:  S1, S2 present without murmurs, rubs, or gallops. Regular rate and rhythm. Abdomen:  +BS, soft, non-tender and non-distended. No rebound or guarding. No HSM or masses noted. Msk:  Symmetrical without gross deformities. Normal posture. Extremities:  Without edema. Neurologic:  Alert and unable to assess orientation Skin:  Intact without significant lesions or rashes. Psych:  Alert and cooperative. Normal mood and affect.

## 2014-10-27 NOTE — Interval H&P Note (Signed)
History and Physical Interval Note:  10/27/2014 9:32 AM  Brandon Wilcox  has presented today for surgery, with the diagnosis of history of polyps  The various methods of treatment have been discussed with the patient and family. After consideration of risks, benefits and other options for treatment, the patient has consented to  Procedure(s) with comments: COLONOSCOPY (N/A) - 930am as a surgical intervention .  The patient's history has been reviewed, patient examined, no change in status, stable for surgery.  I have reviewed the patient's chart and labs.  Questions were answered to the patient's satisfaction.     [name]  No change. The colonoscopy per plan.The risks, benefits, limitations, alternatives and imponderables have been reviewed with the patient. Questions have been answered. All parties are agreeable.

## 2014-10-27 NOTE — Discharge Instructions (Signed)
°Colonoscopy °Discharge Instructions ° °Read the instructions outlined below and refer to this sheet in the next few weeks. These discharge instructions provide you with general information on caring for yourself after you leave the hospital. Your doctor may also give you specific instructions. While your treatment has been planned according to the most current medical practices available, unavoidable complications occasionally occur. If you have any problems or questions after discharge, call Dr. Rourk at [phone]. °ACTIVITY °· You may resume your regular activity, but move at a slower pace for the next 24 hours.  °· Take frequent rest periods for the next 24 hours.  °· Walking will help get rid of the air and reduce the bloated feeling in your belly (abdomen).  °· No driving for 24 hours (because of the medicine (anesthesia) used during the test).   °· Do not sign any important legal documents or operate any machinery for 24 hours (because of the anesthesia used during the test).  °NUTRITION °· Drink plenty of fluids.  °· You may resume your normal diet as instructed by your doctor.  °· Begin with a light meal and progress to your normal diet. Heavy or fried foods are harder to digest and may make you feel sick to your stomach (nauseated).  °· Avoid alcoholic beverages for 24 hours or as instructed.  °MEDICATIONS °· You may resume your normal medications unless your doctor tells you otherwise.  °WHAT YOU CAN EXPECT TODAY °· Some feelings of bloating in the abdomen.  °· Passage of more gas than usual.  °· Spotting of blood in your stool or on the toilet paper.  °IF YOU HAD POLYPS REMOVED DURING THE COLONOSCOPY: °· No aspirin products for 7 days or as instructed.  °· No alcohol for 7 days or as instructed.  °· Eat a soft diet for the next 24 hours.  °FINDING OUT THE RESULTS OF YOUR TEST °Not all test results are available during your visit. If your test results are not back during the visit, make an appointment  with your caregiver to find out the results. Do not assume everything is normal if you have not heard from your caregiver or the medical facility. It is important for you to follow up on all of your test results.  °SEEK IMMEDIATE MEDICAL ATTENTION IF: °· You have more than a spotting of blood in your stool.  °· Your belly is swollen (abdominal distention).  °· You are nauseated or vomiting.  °· You have a temperature over 101.  °· You have abdominal pain or discomfort that is severe or gets worse throughout the day.  ° ° °Polyp information provided ° °Further recommendations to follow pending review of pathology report ° °Colon Polyps °Polyps are lumps of extra tissue growing inside the body. Polyps can grow in the large intestine (colon). Most colon polyps are noncancerous (benign). However, some colon polyps can become cancerous over time. Polyps that are larger than a pea may be harmful. To be safe, caregivers remove and test all polyps. °CAUSES  °Polyps form when mutations in the genes cause your cells to grow and divide even though no more tissue is needed. °RISK FACTORS °There are a number of risk factors that can increase your chances of getting colon polyps. They include: °· Being older than 50 years. °· Family history of colon polyps or colon cancer. °· Long-term colon diseases, such as colitis or Crohn disease. °· Being overweight. °· Smoking. °· Being inactive. °· Drinking too much alcohol. °SYMPTOMS  °  Most small polyps do not cause symptoms. If symptoms are present, they may include: °· Blood in the stool. The stool may look dark red or black. °· Constipation or diarrhea that lasts longer than 1 week. °DIAGNOSIS °People often do not know they have polyps until their caregiver finds them during a regular checkup. Your caregiver can use 4 tests to check for polyps: °· Digital rectal exam. The caregiver wears gloves and feels inside the rectum. This test would find polyps only in the rectum. °· Barium  enema. The caregiver puts a liquid called barium into your rectum before taking X-rays of your colon. Barium makes your colon look white. Polyps are dark, so they are easy to see in the X-ray pictures. °· Sigmoidoscopy. A thin, flexible tube (sigmoidoscope) is placed into your rectum. The sigmoidoscope has a light and tiny camera in it. The caregiver uses the sigmoidoscope to look at the last third of your colon. °· Colonoscopy. This test is like sigmoidoscopy, but the caregiver looks at the entire colon. This is the most common method for finding and removing polyps. °TREATMENT  °Any polyps will be removed during a sigmoidoscopy or colonoscopy. The polyps are then tested for cancer. °PREVENTION  °To help lower your risk of getting more colon polyps: °· Eat plenty of fruits and vegetables. Avoid eating fatty foods. °· Do not smoke. °· Avoid drinking alcohol. °· Exercise every day. °· Lose weight if recommended by your caregiver. °· Eat plenty of calcium and folate. Foods that are rich in calcium include milk, cheese, and broccoli. Foods that are rich in folate include chickpeas, kidney beans, and spinach. °HOME CARE INSTRUCTIONS °Keep all follow-up appointments as directed by your caregiver. You may need periodic exams to check for polyps. °SEEK MEDICAL CARE IF: °You notice bleeding during a bowel movement. °Document Released: 05/09/2004 Document Revised: 11/05/2011 Document Reviewed: 10/23/2011 °Exit[employer]® Patient Information ©2015 [employer]. This information is not intended to replace advice given to you by your health care provider. Make sure you discuss any questions you have with your health care provider. ° °

## 2014-10-28 ENCOUNTER — Encounter (HOSPITAL_COMMUNITY): Payer: Self-pay | Admitting: Internal Medicine

## 2014-10-28 ENCOUNTER — Encounter: Payer: Self-pay | Admitting: Internal Medicine

## 2019-10-08 ENCOUNTER — Encounter: Payer: Self-pay | Admitting: Internal Medicine

## 2020-01-15 DIAGNOSIS — F840 Autistic disorder: Secondary | ICD-10-CM | POA: Diagnosis not present

## 2020-04-19 ENCOUNTER — Ambulatory Visit: Payer: Medicare Other

## 2020-04-20 DIAGNOSIS — F840 Autistic disorder: Secondary | ICD-10-CM | POA: Diagnosis not present

## 2020-05-10 DIAGNOSIS — F71 Moderate intellectual disabilities: Secondary | ICD-10-CM | POA: Diagnosis not present

## 2020-05-10 DIAGNOSIS — Z87820 Personal history of traumatic brain injury: Secondary | ICD-10-CM | POA: Diagnosis not present

## 2020-05-10 DIAGNOSIS — I10 Essential (primary) hypertension: Secondary | ICD-10-CM | POA: Diagnosis not present

## 2020-05-10 DIAGNOSIS — Z79899 Other long term (current) drug therapy: Secondary | ICD-10-CM | POA: Diagnosis not present

## 2020-05-31 ENCOUNTER — Ambulatory Visit: Payer: Self-pay

## 2020-05-31 ENCOUNTER — Other Ambulatory Visit: Payer: Self-pay

## 2020-06-06 DIAGNOSIS — Z87820 Personal history of traumatic brain injury: Secondary | ICD-10-CM | POA: Diagnosis not present

## 2020-06-06 DIAGNOSIS — I10 Essential (primary) hypertension: Secondary | ICD-10-CM | POA: Diagnosis not present

## 2020-06-06 DIAGNOSIS — Z79899 Other long term (current) drug therapy: Secondary | ICD-10-CM | POA: Diagnosis not present

## 2020-06-06 DIAGNOSIS — L980 Pyogenic granuloma: Secondary | ICD-10-CM | POA: Diagnosis not present

## 2020-06-06 DIAGNOSIS — F71 Moderate intellectual disabilities: Secondary | ICD-10-CM | POA: Diagnosis not present

## 2020-06-27 DIAGNOSIS — L980 Pyogenic granuloma: Secondary | ICD-10-CM | POA: Diagnosis not present

## 2020-06-27 DIAGNOSIS — Z87820 Personal history of traumatic brain injury: Secondary | ICD-10-CM | POA: Diagnosis not present

## 2020-06-27 DIAGNOSIS — F71 Moderate intellectual disabilities: Secondary | ICD-10-CM | POA: Diagnosis not present

## 2020-07-14 DIAGNOSIS — F840 Autistic disorder: Secondary | ICD-10-CM | POA: Diagnosis not present

## 2020-10-07 DIAGNOSIS — F840 Autistic disorder: Secondary | ICD-10-CM | POA: Diagnosis not present

## 2020-10-27 DIAGNOSIS — F71 Moderate intellectual disabilities: Secondary | ICD-10-CM | POA: Diagnosis not present

## 2020-10-27 DIAGNOSIS — Z79899 Other long term (current) drug therapy: Secondary | ICD-10-CM | POA: Diagnosis not present

## 2020-10-27 DIAGNOSIS — I10 Essential (primary) hypertension: Secondary | ICD-10-CM | POA: Diagnosis not present

## 2020-10-27 DIAGNOSIS — Z125 Encounter for screening for malignant neoplasm of prostate: Secondary | ICD-10-CM | POA: Diagnosis not present

## 2020-10-27 DIAGNOSIS — Z0000 Encounter for general adult medical examination without abnormal findings: Secondary | ICD-10-CM | POA: Diagnosis not present

## 2020-10-27 DIAGNOSIS — Z87820 Personal history of traumatic brain injury: Secondary | ICD-10-CM | POA: Diagnosis not present

## 2021-05-17 ENCOUNTER — Encounter: Payer: Self-pay | Admitting: Internal Medicine

## 2021-09-28 NOTE — Progress Notes (Deleted)
Referring Provider: Leonie Douglas, MD Primary Care Physician:  Leonie Douglas, MD Primary Gastroenterologist:  Dr. Gala Romney  No chief complaint on file.   HPI:   Brandon Wilcox is a 61 y.o. male presenting today at the request of Dr. Leonie Douglas for consult colonoscopy and weight loss.   Last colonoscopy March 2016 with 3 mm tubular adenoma removed.  Recommended repeat colonoscopy in 5 years.  Reviewed labs from August 2022.  CBC, CMP within normal limits.  Today:    Past Medical History:  Diagnosis Date   Chronic constipation    History of flexible sigmoidoscopy 3/99   Normal  to 60 cm ACBE normal   Hyperlipidemia    Hypertension    Mental retardation    Poor historian     Past Surgical History:  Procedure Laterality Date   COLONOSCOPY  09/02/2009   RMR: 1. Normal rectum 2. Tortuous colon. 3. Hepatic flexure polyp, status post cold snare polypectomy. 4. the remainder colonic mucosa appeared normal, although a poor preparation compromised the examination. +tubular adenoma   COLONOSCOPY N/A 10/27/2014   Procedure: COLONOSCOPY;  Surgeon: Daneil Dolin, MD;  Location: [facility];  Service: Endoscopy;  Laterality: N/A;  930am    Current Outpatient Medications  Medication Sig Dispense Refill   bisacodyl (DULCOLAX) 5 MG EC tablet Take 1 tablet (5 mg total) by mouth daily as needed for moderate constipation. 4 tablet 0   Calcium Carbonate Antacid (CALCIUM ANTACID PO) Take by mouth.       folic acid (FOLVITE) 1 MG tablet Take 1 mg by mouth daily.       lactulose (CHRONULAC) 10 GM/15ML solution Take 20 g by mouth 2 (two) times daily.      lamoTRIgine (LAMICTAL) 100 MG tablet Take 100 mg by mouth 2 (two) times daily.      Multiple Vitamins-Minerals (CERTAGEN PO) Take by mouth.       niacin (NIASPAN) 500 MG CR tablet Take 1,500 mg by mouth at bedtime.     phosphate laxative (FLEET) 2.7-7.2 GM/15ML solution Take 15 mLs by mouth once. 45 mL 0   polyethylene glycol  (MIRALAX / GLYCOLAX) packet Take 17 g by mouth daily.     polyethylene glycol-electrolytes (NULYTELY/GOLYTELY) 420 G solution Take 4,000 mLs by mouth once. 4000 mL 0   propranolol (INDERAL) 40 MG tablet Take 40 mg by mouth 4 (four) times daily.      QUEtiapine (SEROQUEL XR) 300 MG 24 hr tablet Take 600 mg by mouth at bedtime.      selenium sulfide (SELSUN) 2.5 % shampoo Apply 1 application topically daily as needed. itching     No current facility-administered medications for this visit.    Allergies as of 09/29/2021   (No Known Allergies)    No family history on file.  Social History   Socioeconomic History   Marital status: Single    Spouse name: Not on file   Number of children: Not on file   Years of education: Not on file   Highest education level: Not on file  Occupational History   Not on file  Tobacco Use   Smoking status: Every Day    Packs/day: 0.25    Types: Cigarettes   Smokeless tobacco: Not on file  Substance and Sexual Activity   Alcohol use: No   Drug use: No   Sexual activity: Not on file  Other Topics Concern   Not on file  Social History Narrative   Not  on file   Social Determinants of Health   Financial Resource Strain: Not on file  Food Insecurity: Not on file  Transportation Needs: Not on file  Physical Activity: Not on file  Stress: Not on file  Social Connections: Not on file  Intimate Partner Violence: Not on file    Review of Systems: Gen: Denies any fever, chills, fatigue, weight loss, lack of appetite.  CV: Denies chest pain, heart palpitations, peripheral edema, syncope.  Resp: Denies shortness of breath at rest or with exertion. Denies wheezing or cough.  GI: Denies dysphagia or odynophagia. Denies jaundice, hematemesis, fecal incontinence. GU : Denies urinary burning, urinary frequency, urinary hesitancy MS: Denies joint pain, muscle weakness, cramps, or limitation of movement.  Derm: Denies rash, itching, dry skin Psych: Denies  depression, anxiety, memory loss, and confusion Heme: Denies bruising, bleeding, and enlarged lymph nodes.  Physical Exam: There were no vitals taken for this visit. General:   Alert and oriented. Pleasant and cooperative. Well-nourished and well-developed.  Head:  Normocephalic and atraumatic. Eyes:  Without icterus, sclera clear and conjunctiva pink.  Ears:  Normal auditory acuity. Nose:  No deformity, discharge,  or lesions. Mouth:  No deformity or lesions, oral mucosa pink.  Neck:  Supple, without mass or thyromegaly. Lungs:  Clear to auscultation bilaterally. No wheezes, rales, or rhonchi. No distress.  Heart:  S1, S2 present without murmurs appreciated.  Abdomen:  +BS, soft, non-tender and non-distended. No HSM noted. No guarding or rebound. No masses appreciated.  Rectal:  Deferred  Msk:  Symmetrical without gross deformities. Normal posture. Pulses:  Normal pulses noted. Extremities:  Without clubbing or edema. Neurologic:  Alert and  oriented x4;  grossly normal neurologically. Skin:  Intact without significant lesions or rashes. Cervical Nodes:  No significant cervical adenopathy. Psych:  Alert and cooperative. Normal mood and affect.

## 2021-09-29 ENCOUNTER — Ambulatory Visit: Payer: Medicare Other | Admitting: Gastroenterology

## 2021-09-29 ENCOUNTER — Encounter: Payer: Self-pay | Admitting: Gastroenterology

## 2021-10-25 ENCOUNTER — Ambulatory Visit (INDEPENDENT_AMBULATORY_CARE_PROVIDER_SITE_OTHER): Payer: Medicare HMO | Admitting: Podiatry

## 2021-10-25 ENCOUNTER — Other Ambulatory Visit: Payer: Self-pay

## 2021-10-25 ENCOUNTER — Encounter: Payer: Self-pay | Admitting: Podiatry

## 2021-10-25 DIAGNOSIS — B351 Tinea unguium: Secondary | ICD-10-CM | POA: Diagnosis not present

## 2021-10-25 DIAGNOSIS — M79674 Pain in right toe(s): Secondary | ICD-10-CM

## 2021-10-25 DIAGNOSIS — M79675 Pain in left toe(s): Secondary | ICD-10-CM

## 2021-10-25 NOTE — Progress Notes (Signed)
?  Subjective:  ?Patient ID: Brandon Wilcox, male    DOB: 05-Jul-1961,  MRN: [id number] ? ?Chief Complaint  ?Patient presents with  ? Nail Problem  ?  Nail trim   ? ?61 y.o. male returns for the above complaint.  Patient presents with thickened elongated dystrophic toenails x10.  Pain on palpation.  Patient would like to have them debrided down.  He states is painful to touch painful to walk on.  He will he is not able to do it himself. ? ?Objective:  ?There were no vitals filed for this visit. ?Podiatric Exam: ?Vascular: dorsalis pedis and posterior tibial pulses are palpable bilateral. Capillary return is immediate. Temperature gradient is WNL. Skin turgor WNL  ?Sensorium: Normal Semmes Weinstein monofilament test. Normal tactile sensation bilaterally. ?Nail Exam: Pt has thick disfigured discolored nails with subungual debris noted bilateral entire nail hallux through fifth toenails.  Pain on palpation to the nails. ?Ulcer Exam: There is no evidence of ulcer or pre-ulcerative changes or infection. ?Orthopedic Exam: Muscle tone and strength are WNL. No limitations in general ROM. No crepitus or effusions noted.  ?Skin: No Porokeratosis. No infection or ulcers ? ? ? ?Assessment & Plan:  ? ?1. Pain due to onychomycosis of toenails of both feet   ? ? ?Patient was evaluated and treated and all questions answered. ? ?Onychomycosis with pain  ?-Nails palliatively debrided as below. ?-Educated on self-care ? ?Procedure: Nail Debridement ?Rationale: pain  ?Type of Debridement: manual, sharp debridement. ?Instrumentation: Nail nipper, rotary burr. ?Number of Nails: 10 ? ?Procedures and Treatment: Consent by patient was obtained for treatment procedures. The patient understood the discussion of treatment and procedures well. All questions were answered thoroughly reviewed. Debridement of mycotic and hypertrophic toenails, 1 through 5 bilateral and clearing of subungual debris. No ulceration, no infection noted.  ?Return  Visit-Office Procedure: Patient instructed to return to the office for a follow up visit 3 months for continued evaluation and treatment. ? ?Boneta Lucks, DPM ?  ? ?Return in about 3 months (around 01/25/2022). ? ?

## 2022-01-09 NOTE — Progress Notes (Deleted)
Referring Provider:  Leonie Douglas, MD Primary Care Physician:  Leonie Douglas, MD Primary Gastroenterologist:  Dr. Gala Romney  No chief complaint on file.   HPI:   Brandon Wilcox is a 61 y.o. male presenting today at the request of   Leonie Douglas, MD for consult colonoscopy and weight loss.    Last colonoscopy March 2016 with 3 mm tubular adenoma removed.  Recommended repeat colonoscopy in 5 years.   Reviewed labs from August 2022.  CBC, CMP within normal limits.  Today:   Past Medical History:  Diagnosis Date   Chronic constipation    History of flexible sigmoidoscopy 3/99   Normal  to 60 cm ACBE normal   Hyperlipidemia    Hypertension    Mental retardation    Poor historian     Past Surgical History:  Procedure Laterality Date   COLONOSCOPY  09/02/2009   RMR: 1. Normal rectum 2. Tortuous colon. 3. Hepatic flexure polyp, status post cold snare polypectomy. 4. the remainder colonic mucosa appeared normal, although a poor preparation compromised the examination. +tubular adenoma   COLONOSCOPY N/A 10/27/2014   Surgeon: Daneil Dolin, MD;  3 mm tubular adenoma removed.  Recommended repeat colonoscopy in 5 years.    Current Outpatient Medications  Medication Sig Dispense Refill   bisacodyl (DULCOLAX) 5 MG EC tablet Take 1 tablet (5 mg total) by mouth daily as needed for moderate constipation. 4 tablet 0   Calcium Carbonate Antacid (CALCIUM ANTACID PO) Take by mouth.       folic acid (FOLVITE) 1 MG tablet Take 1 mg by mouth daily.       lactulose (CHRONULAC) 10 GM/15ML solution Take 20 g by mouth 2 (two) times daily.      lamoTRIgine (LAMICTAL) 100 MG tablet Take 100 mg by mouth 2 (two) times daily.      Multiple Vitamins-Minerals (CERTAGEN PO) Take by mouth.       niacin (NIASPAN) 500 MG CR tablet Take 1,500 mg by mouth at bedtime.     phosphate laxative (FLEET) 2.7-7.2 GM/15ML solution Take 15 mLs by mouth once. 45 mL 0   polyethylene glycol (MIRALAX /  GLYCOLAX) packet Take 17 g by mouth daily.     polyethylene glycol-electrolytes (NULYTELY/GOLYTELY) 420 G solution Take 4,000 mLs by mouth once. 4000 mL 0   propranolol (INDERAL) 40 MG tablet Take 40 mg by mouth 4 (four) times daily.      QUEtiapine (SEROQUEL XR) 300 MG 24 hr tablet Take 600 mg by mouth at bedtime.      selenium sulfide (SELSUN) 2.5 % shampoo Apply 1 application topically daily as needed. itching     No current facility-administered medications for this visit.    Allergies as of 01/11/2022   (No Known Allergies)    No family history on file.  Social History   Socioeconomic History   Marital status: Single    Spouse name: Not on file   Number of children: Not on file   Years of education: Not on file   Highest education level: Not on file  Occupational History   Not on file  Tobacco Use   Smoking status: Every Day    Packs/day: 0.25    Types: Cigarettes   Smokeless tobacco: Not on file  Substance and Sexual Activity   Alcohol use: No   Drug use: No   Sexual activity: Not on file  Other Topics Concern   Not on file  Social History Narrative  Not on file   Social Determinants of Health   Financial Resource Strain: Not on file  Food Insecurity: Not on file  Transportation Needs: Not on file  Physical Activity: Not on file  Stress: Not on file  Social Connections: Not on file  Intimate Partner Violence: Not on file    Review of Systems: Gen: Denies any fever, chills, fatigue, weight loss, lack of appetite.  CV: Denies chest pain, heart palpitations, peripheral edema, syncope.  Resp: Denies shortness of breath at rest or with exertion. Denies wheezing or cough.  GI: Denies dysphagia or odynophagia. Denies jaundice, hematemesis, fecal incontinence. GU : Denies urinary burning, urinary frequency, urinary hesitancy MS: Denies joint pain, muscle weakness, cramps, or limitation of movement.  Derm: Denies rash, itching, dry skin Psych: Denies  depression, anxiety, memory loss, and confusion Heme: Denies bruising, bleeding, and enlarged lymph nodes.  Physical Exam: There were no vitals taken for this visit. General:   Alert and oriented. Pleasant and cooperative. Well-nourished and well-developed.  Head:  Normocephalic and atraumatic. Eyes:  Without icterus, sclera clear and conjunctiva pink.  Ears:  Normal auditory acuity. Lungs:  Clear to auscultation bilaterally. No wheezes, rales, or rhonchi. No distress.  Heart:  S1, S2 present without murmurs appreciated.  Abdomen:  +BS, soft, non-tender and non-distended. No HSM noted. No guarding or rebound. No masses appreciated.  Rectal:  Deferred  Msk:  Symmetrical without gross deformities. Normal posture. Extremities:  Without edema. Neurologic:  Alert and  oriented x4;  grossly normal neurologically. Skin:  Intact without significant lesions or rashes. Psych:  Alert and cooperative. Normal mood and affect.    Assessment:     Plan:  ***   Aliene Altes, PA-C [facility] 01/11/2022

## 2022-01-11 ENCOUNTER — Ambulatory Visit: Payer: Medicare HMO | Admitting: Gastroenterology

## 2022-01-31 ENCOUNTER — Ambulatory Visit: Payer: Medicare HMO | Admitting: Podiatry

## 2022-02-14 ENCOUNTER — Ambulatory Visit: Payer: Medicare HMO | Admitting: Podiatry

## 2022-03-09 ENCOUNTER — Ambulatory Visit (INDEPENDENT_AMBULATORY_CARE_PROVIDER_SITE_OTHER): Payer: Medicare HMO | Admitting: Podiatry

## 2022-03-09 ENCOUNTER — Encounter: Payer: Self-pay | Admitting: Podiatry

## 2022-03-09 DIAGNOSIS — B351 Tinea unguium: Secondary | ICD-10-CM | POA: Diagnosis not present

## 2022-03-09 DIAGNOSIS — L84 Corns and callosities: Secondary | ICD-10-CM

## 2022-03-09 DIAGNOSIS — M79674 Pain in right toe(s): Secondary | ICD-10-CM | POA: Diagnosis not present

## 2022-03-09 DIAGNOSIS — M79675 Pain in left toe(s): Secondary | ICD-10-CM | POA: Diagnosis not present

## 2022-03-09 NOTE — Progress Notes (Signed)
This patient presents to the office with chief complaint of long thick painful nails.  Patient says the nails are painful walking and wearing shoes.  This patient is unable to self treat.  This patient is unable to trim his  nails since she is unable to reach his nails.  He presents to the office with a caregiver.he presents to the office for preventative foot care services.  General Appearance  Alert, conversant and in no acute stress.  Vascular  Dorsalis pedis and posterior tibial  pulses are palpable  bilaterally.  Capillary return is within normal limits  bilaterally. Temperature is within normal limits  bilaterally.  Neurologic  Senn-Weinstein monofilament wire test within normal limits  bilaterally. Muscle power within normal limits bilaterally.  Nails Thick disfigured discolored nails with subungual debris  from hallux to fifth toes bilaterally. No evidence of bacterial infection or drainage bilaterally.  Orthopedic  No limitations of motion  feet .  No crepitus or effusions noted.  No bony pathology or digital deformities noted. Asymptomatic HD 5th toe left foot.  Skin  normotropic skin with no porokeratosis noted bilaterally.  No signs of infections or ulcers noted.     Onychomycosis  Nails  B/L.  Pain in right toes  Pain in left toes  Debridement of nails both feet followed trimming the nails with dremel tool.    RTC 3 months.   Gardiner Barefoot DPM

## 2022-03-21 NOTE — Progress Notes (Deleted)
Referring Provider: Leonie Douglas, MD Primary Care Physician:  Leonie Douglas, MD Primary Gastroenterologist:  Dr. Rayne Du chief complaint on file.   HPI:   Brandon Wilcox is a 61 y.o. male, mentally challenged, presenting today at the request of Leonie Douglas, MD for consult colonoscopy and weight loss.  We have not seen patient since 2016.  At that time, we are scheduling him for surveillance colonoscopy due to history of adenomatous colon polyps.  Colonoscopy was completed 10/27/2014 with 3 mm tubular adenoma removed.  Recommended repeat colonoscopy in 5 years.    Today:      Weights in care everywhere continuity of care document: Weight 165.6 lbs 2022-03-15  Weight 168.2 lbs 2021-09-25  Weight 167.4 lbs 2021-05-11  Weight 185.6 lbs 2020-10-27  Weight 174 lbs 2020-06-27  Weight 176.4 lbs 2020-06-06  Weight 169.2 lbs 2020-05-10  Weight 170.8 lbs 2019-10-22  Weight 169 lbs 2018-09-23   Reviewed continuity of care document in care everywhere.  Labs completed 03/15/2022.  CMP unremarkable, CBC unremarkable, hemoglobin A1c 5.2, TSH 1.58.  Past Medical History:  Diagnosis Date   Chronic constipation    History of flexible sigmoidoscopy 3/99   Normal  to 60 cm ACBE normal   Hyperlipidemia    Hypertension    Mental retardation    Poor historian     Past Surgical History:  Procedure Laterality Date   COLONOSCOPY  09/02/2009   RMR: 1. Normal rectum 2. Tortuous colon. 3. Hepatic flexure polyp, status post cold snare polypectomy. 4. the remainder colonic mucosa appeared normal, although a poor preparation compromised the examination. +tubular adenoma   COLONOSCOPY N/A 10/27/2014   Surgeon: Daneil Dolin, MD;  3 mm tubular adenoma removed.  Recommended repeat colonoscopy in 5 years.    Current Outpatient Medications  Medication Sig Dispense Refill   bisacodyl (DULCOLAX) 5 MG EC tablet Take 1 tablet (5 mg total) by mouth daily as needed for moderate  constipation. 4 tablet 0   Calcium Carbonate Antacid (CALCIUM ANTACID PO) Take by mouth.       folic acid (FOLVITE) 1 MG tablet Take 1 mg by mouth daily.       lactulose (CHRONULAC) 10 GM/15ML solution Take 20 g by mouth 2 (two) times daily.      lamoTRIgine (LAMICTAL) 100 MG tablet Take 100 mg by mouth 2 (two) times daily.      Multiple Vitamins-Minerals (CERTAGEN PO) Take by mouth.       niacin (NIASPAN) 500 MG CR tablet Take 1,500 mg by mouth at bedtime.     phosphate laxative (FLEET) 2.7-7.2 GM/15ML solution Take 15 mLs by mouth once. 45 mL 0   polyethylene glycol (MIRALAX / GLYCOLAX) packet Take 17 g by mouth daily.     polyethylene glycol-electrolytes (NULYTELY/GOLYTELY) 420 G solution Take 4,000 mLs by mouth once. 4000 mL 0   propranolol (INDERAL) 40 MG tablet Take 40 mg by mouth 4 (four) times daily.      QUEtiapine (SEROQUEL XR) 300 MG 24 hr tablet Take 600 mg by mouth at bedtime.      selenium sulfide (SELSUN) 2.5 % shampoo Apply 1 application topically daily as needed. itching     No current facility-administered medications for this visit.    Allergies as of 03/22/2022   (No Known Allergies)    No family history on file.  Social History   Socioeconomic History   Marital status: Single    Spouse name: Not on file   Number  of children: Not on file   Years of education: Not on file   Highest education level: Not on file  Occupational History   Not on file  Tobacco Use   Smoking status: Every Day    Packs/day: 0.25    Types: Cigarettes   Smokeless tobacco: Not on file  Substance and Sexual Activity   Alcohol use: No   Drug use: No   Sexual activity: Not on file  Other Topics Concern   Not on file  Social History Narrative   Not on file   Social Determinants of Health   Financial Resource Strain: Not on file  Food Insecurity: Not on file  Transportation Needs: Not on file  Physical Activity: Not on file  Stress: Not on file  Social Connections: Not on file   Intimate Partner Violence: Not on file    Review of Systems: Gen: Denies any fever, chills, cold or flu like symptoms, pre-syncope, or syncope.  CV: Denies chest pain, heart palpitations. Resp: Denies shortness of breath, cough.  GI: See HPI GU : Denies urinary burning, urinary frequency, urinary hesitancy MS: Denies joint pain. Derm: Denies rash. Psych: Denies depression, anxiety. Heme: See HPI  Physical Exam: There were no vitals taken for this visit. General:   Alert and oriented. Pleasant and cooperative. Well-nourished and well-developed.  Head:  Normocephalic and atraumatic. Eyes:  Without icterus, sclera clear and conjunctiva pink.  Ears:  Normal auditory acuity. Lungs:  Clear to auscultation bilaterally. No wheezes, rales, or rhonchi. No distress.  Heart:  S1, S2 present without murmurs appreciated.  Abdomen:  +BS, soft, non-tender and non-distended. No HSM noted. No guarding or rebound. No masses appreciated.  Rectal:  Deferred  Msk:  Symmetrical without gross deformities. Normal posture. Extremities:  Without edema. Neurologic:  Alert and  oriented x4;  grossly normal neurologically. Skin:  Intact without significant lesions or rashes. Psych:  Normal mood and affect.    Assessment:     Plan:  ***   Aliene Altes, PA-C [facility] 03/22/2022

## 2022-03-22 ENCOUNTER — Ambulatory Visit: Payer: Medicare HMO | Admitting: Gastroenterology

## 2022-06-15 ENCOUNTER — Encounter: Payer: Self-pay | Admitting: Podiatry

## 2022-06-15 ENCOUNTER — Ambulatory Visit (INDEPENDENT_AMBULATORY_CARE_PROVIDER_SITE_OTHER): Payer: Medicare HMO | Admitting: Podiatry

## 2022-06-15 DIAGNOSIS — M79675 Pain in left toe(s): Secondary | ICD-10-CM | POA: Diagnosis not present

## 2022-06-15 DIAGNOSIS — B351 Tinea unguium: Secondary | ICD-10-CM

## 2022-06-15 DIAGNOSIS — L84 Corns and callosities: Secondary | ICD-10-CM

## 2022-06-15 DIAGNOSIS — M79674 Pain in right toe(s): Secondary | ICD-10-CM | POA: Diagnosis not present

## 2022-06-15 NOTE — Progress Notes (Signed)
This patient presents to the office with chief complaint of long thick painful nails.  Patient says the nails are painful walking and wearing shoes.  This patient is unable to self treat.  This patient is unable to trim his  nails since she is unable to reach his nails.  He presents to the office with a caregiver.he presents to the office for preventative foot care services.  General Appearance  Alert, conversant and in no acute stress.  Vascular  Dorsalis pedis and posterior tibial  pulses are palpable  bilaterally.  Capillary return is within normal limits  bilaterally. Temperature is within normal limits  bilaterally.  Neurologic  Senn-Weinstein monofilament wire test within normal limits  bilaterally. Muscle power within normal limits bilaterally.  Nails Thick disfigured discolored nails with subungual debris  from hallux to fifth toes bilaterally. No evidence of bacterial infection or drainage bilaterally.  Orthopedic  No limitations of motion  feet .  No crepitus or effusions noted.  No bony pathology or digital deformities noted. Asymptomatic HD 5th toe left foot.  Skin  normotropic skin with no porokeratosis noted bilaterally.  No signs of infections or ulcers noted.     Onychomycosis  Nails  B/L.  Pain in right toes  Pain in left toes  Debridement of nails both feet followed trimming the nails with dremel tool.    RTC 3 months.   Gardiner Barefoot DPM

## 2022-09-19 ENCOUNTER — Ambulatory Visit (INDEPENDENT_AMBULATORY_CARE_PROVIDER_SITE_OTHER): Payer: Medicare HMO | Admitting: Podiatry

## 2022-09-19 ENCOUNTER — Encounter: Payer: Self-pay | Admitting: Podiatry

## 2022-09-19 ENCOUNTER — Ambulatory Visit: Payer: Medicare HMO | Admitting: Podiatry

## 2022-09-19 DIAGNOSIS — M79675 Pain in left toe(s): Secondary | ICD-10-CM | POA: Diagnosis not present

## 2022-09-19 DIAGNOSIS — B351 Tinea unguium: Secondary | ICD-10-CM | POA: Diagnosis not present

## 2022-09-19 DIAGNOSIS — M79674 Pain in right toe(s): Secondary | ICD-10-CM | POA: Diagnosis not present

## 2022-09-19 NOTE — Progress Notes (Addendum)
This patient presents to the office with chief complaint of long thick painful nails.  Patient says the nails are painful walking and wearing shoes.  This patient is unable to self treat.  This patient is unable to trim his  nails since she is unable to reach his nails.  He presents to the office with a caregiver.he presents to the office for preventative foot care services.  General Appearance  Alert, conversant and in no acute stress.  Vascular  Dorsalis pedis and posterior tibial  pulses are palpable  bilaterally.  Capillary return is within normal limits  bilaterally. Temperature is within normal limits  bilaterally.  Neurologic  Senn-Weinstein monofilament wire test within normal limits  bilaterally. Muscle power within normal limits bilaterally.  Nails Thick disfigured discolored nails with subungual debris  from hallux to fifth toes bilaterally. No evidence of bacterial infection or drainage bilaterally.  Orthopedic  No limitations of motion  feet .  No crepitus or effusions noted.  No bony pathology or digital deformities noted.  Skin  normotropic skin with no porokeratosis noted bilaterally.  No signs of infections or ulcers noted.     Onychomycosis  Nails  B/L.  Pain in right toes  Pain in left toes  Debridement of nails both feet followed trimming the nails with dremel tool.   He was very active during footcare.   RTC 4 months.   Gardiner Barefoot DPM

## 2023-01-18 ENCOUNTER — Ambulatory Visit (INDEPENDENT_AMBULATORY_CARE_PROVIDER_SITE_OTHER): Payer: Medicare HMO | Admitting: Podiatry

## 2023-01-18 ENCOUNTER — Encounter: Payer: Self-pay | Admitting: Podiatry

## 2023-01-18 DIAGNOSIS — M79675 Pain in left toe(s): Secondary | ICD-10-CM

## 2023-01-18 DIAGNOSIS — B351 Tinea unguium: Secondary | ICD-10-CM | POA: Diagnosis not present

## 2023-01-18 DIAGNOSIS — M79674 Pain in right toe(s): Secondary | ICD-10-CM

## 2023-01-18 NOTE — Progress Notes (Signed)
This patient presents to the office with chief complaint of long thick painful nails.  Patient says the nails are painful walking and wearing shoes.  This patient is unable to self treat.  This patient is unable to trim his  nails since she is unable to reach his nails.  He presents to the office with a caregiver.he presents to the office for preventative foot care services.  General Appearance  Alert, conversant and in no acute stress.  Vascular  Dorsalis pedis and posterior tibial  pulses are palpable  bilaterally.  Capillary return is within normal limits  bilaterally. Temperature is within normal limits  bilaterally.  Neurologic  Senn-Weinstein monofilament wire test within normal limits  bilaterally. Muscle power within normal limits bilaterally.  Nails Thick disfigured discolored nails with subungual debris  from hallux to fifth toes bilaterally. No evidence of bacterial infection or drainage bilaterally.  Orthopedic  No limitations of motion  feet .  No crepitus or effusions noted.  No bony pathology or digital deformities noted.  Skin  normotropic skin with no porokeratosis noted bilaterally.  No signs of infections or ulcers noted.     Onychomycosis  Nails  B/L.  Pain in right toes  Pain in left toes  Debridement of nails both feet followed trimming the nails with dremel tool.   He was very active during footcare last visit.  RTC 3  months.   Helane Gunther DPM

## 2023-04-19 ENCOUNTER — Encounter: Payer: Self-pay | Admitting: Podiatry

## 2023-04-19 ENCOUNTER — Ambulatory Visit: Payer: Medicare HMO | Admitting: Podiatry

## 2023-04-19 DIAGNOSIS — M79675 Pain in left toe(s): Secondary | ICD-10-CM

## 2023-04-19 DIAGNOSIS — B351 Tinea unguium: Secondary | ICD-10-CM

## 2023-04-19 DIAGNOSIS — M79674 Pain in right toe(s): Secondary | ICD-10-CM

## 2023-04-19 DIAGNOSIS — F79 Unspecified intellectual disabilities: Secondary | ICD-10-CM

## 2023-04-19 NOTE — Progress Notes (Signed)
This patient presents to the office with chief complaint of long thick painful nails.  Patient says the nails are painful walking and wearing shoes.  This patient is unable to self treat.  This patient is unable to trim his  nails since she is unable to reach his nails.  He presents to the office with a caregiver.he presents to the office for preventative foot care services.  General Appearance  Alert, conversant and in no acute stress.  Vascular  Dorsalis pedis and posterior tibial  pulses are palpable  bilaterally.  Capillary return is within normal limits  bilaterally. Temperature is within normal limits  bilaterally.  Neurologic  Senn-Weinstein monofilament wire test within normal limits  bilaterally. Muscle power within normal limits bilaterally.  Nails Thick disfigured discolored nails with subungual debris  from hallux to fifth toes bilaterally. No evidence of bacterial infection or drainage bilaterally.  Orthopedic  No limitations of motion  feet .  No crepitus or effusions noted.  No bony pathology or digital deformities noted.  Skin  normotropic skin with no porokeratosis noted bilaterally.  No signs of infections or ulcers noted.     Onychomycosis  Nails  B/L.  Pain in right toes  Pain in left toes  Debridement of nails both feet followed trimming the nails without  dremel tool.   He was very active during footcare last visit.  RTC 3  months.   Helane Gunther DPM

## 2023-05-17 ENCOUNTER — Other Ambulatory Visit: Payer: Self-pay | Admitting: Family Medicine

## 2023-06-18 ENCOUNTER — Encounter: Payer: Self-pay | Admitting: *Deleted

## 2023-07-22 ENCOUNTER — Ambulatory Visit (INDEPENDENT_AMBULATORY_CARE_PROVIDER_SITE_OTHER): Payer: Medicare HMO | Admitting: Podiatry

## 2023-07-22 ENCOUNTER — Encounter: Payer: Self-pay | Admitting: Podiatry

## 2023-07-22 DIAGNOSIS — B351 Tinea unguium: Secondary | ICD-10-CM | POA: Diagnosis not present

## 2023-07-22 DIAGNOSIS — F79 Unspecified intellectual disabilities: Secondary | ICD-10-CM

## 2023-07-22 DIAGNOSIS — M79675 Pain in left toe(s): Secondary | ICD-10-CM | POA: Diagnosis not present

## 2023-07-22 DIAGNOSIS — M79674 Pain in right toe(s): Secondary | ICD-10-CM

## 2023-07-22 NOTE — Progress Notes (Signed)
This patient presents to the office with chief complaint of long thick painful nails.  Patient says the nails are painful walking and wearing shoes.  This patient is unable to self treat.  This patient is unable to trim his  nails since she is unable to reach his nails.  He presents to the office with a caregiver.he presents to the office for preventative foot care services.  General Appearance  Alert, conversant and in no acute stress.  Vascular  Dorsalis pedis and posterior tibial  pulses are palpable  bilaterally.  Capillary return is within normal limits  bilaterally. Temperature is within normal limits  bilaterally.  Neurologic  Senn-Weinstein monofilament wire test within normal limits  bilaterally. Muscle power within normal limits bilaterally.  Nails Thick disfigured discolored nails with subungual debris  from hallux to fifth toes bilaterally. No evidence of bacterial infection or drainage bilaterally.  Orthopedic  No limitations of motion  feet .  No crepitus or effusions noted.  No bony pathology or digital deformities noted.  Skin  normotropic skin with no porokeratosis noted bilaterally.  No signs of infections or ulcers noted.     Onychomycosis  Nails  B/L.  Pain in right toes  Pain in left toes  Debridement of nails both feet followed trimming the nails without  dremel tool.   He was very active during footcare last visit.  RTC 3  months.   Helane Gunther DPM

## 2023-08-06 ENCOUNTER — Ambulatory Visit: Payer: Medicare HMO | Admitting: Audiologist

## 2023-08-08 ENCOUNTER — Ambulatory Visit: Payer: Medicare HMO | Attending: Sports Medicine | Admitting: Audiology

## 2023-08-08 DIAGNOSIS — H9193 Unspecified hearing loss, bilateral: Secondary | ICD-10-CM | POA: Diagnosis present

## 2023-08-08 NOTE — Procedures (Signed)
  Outpatient Audiology and [facility] [address] [place], [place]  [address] [phone]  AUDIOLOGICAL  EVALUATION  NAME: Brandon Wilcox     DOB:   1961-03-25      MRN: [id number]                                                                                     DATE: 08/08/2023     REFERENT: Waldon Reining, MD STATUS: Outpatient DIAGNOSIS: decreased hearing     History: [name] was seen for an audiological evaluation. [name] was accompanied to the appointment by a caregiver. [name] is in the [employer]. [name]'s caregiver denies concerns regarding [name]'s hearing sensitivity.   Evaluation:  Otoscopy showed a clear view of the tympanic membranes, bilaterally Tympanometry was attempted however a hermetic seal could not be maintained to measure tympanometry.  Audiometric testing was attempted using two tester Conditioned Play Audiometry [name]) techniques with headphones.  A Speech Detection Threshold was obtained at 20 dB HL in the right ear and at 20 dB HL in the left ear. [name] could not be conditioned to respond to frequency-specific stimuli.   Results:  The test results were reviewed with [name]'s caregiver. Speech Detection Thresholds were obtained in the normal hearing range in both ears. Further audiological testing could not be completed due to [name]'s cognitive level.   Recommendations: 1.   No further audiologic testing is recommended at this time unless future hearing concerns arise.   If you have any questions please feel free to contact me at [phone].  Marton Redwood Audiologist, Au.D., CCC-A 08/08/2023  10:20 AM  Test Assist: Ammie Ferrier, Au.D.   Cc: Waldon Reining, MD

## 2023-10-21 ENCOUNTER — Ambulatory Visit (INDEPENDENT_AMBULATORY_CARE_PROVIDER_SITE_OTHER): Payer: Medicare HMO | Admitting: Podiatry

## 2023-10-21 ENCOUNTER — Encounter: Payer: Self-pay | Admitting: Podiatry

## 2023-10-21 DIAGNOSIS — M79674 Pain in right toe(s): Secondary | ICD-10-CM | POA: Diagnosis not present

## 2023-10-21 DIAGNOSIS — M79675 Pain in left toe(s): Secondary | ICD-10-CM | POA: Diagnosis not present

## 2023-10-21 DIAGNOSIS — F79 Unspecified intellectual disabilities: Secondary | ICD-10-CM

## 2023-10-21 DIAGNOSIS — B351 Tinea unguium: Secondary | ICD-10-CM | POA: Diagnosis not present

## 2023-10-21 NOTE — Progress Notes (Signed)
 This patient presents to the office with chief complaint of long thick painful nails.  Patient says the nails are painful walking and wearing shoes.  This patient is unable to self treat.  This patient is unable to trim his  nails since she is unable to reach his nails.  He presents to the office with a caregiver.he presents to the office for preventative foot care services.  General Appearance  Alert, conversant and in no acute stress.  Vascular  Dorsalis pedis and posterior tibial  pulses are palpable  bilaterally.  Capillary return is within normal limits  bilaterally. Temperature is within normal limits  bilaterally.  Neurologic  Senn-Weinstein monofilament wire test within normal limits  bilaterally. Muscle power within normal limits bilaterally.  Nails Thick disfigured discolored nails with subungual debris  from hallux to fifth toes bilaterally. No evidence of bacterial infection or drainage bilaterally.  Orthopedic  No limitations of motion  feet .  No crepitus or effusions noted.  No bony pathology or digital deformities noted.  Skin  normotropic skin with no porokeratosis noted bilaterally.  No signs of infections or ulcers noted.     Onychomycosis  Nails  B/L.  Pain in right toes  Pain in left toes  Debridement of nails both feet followed trimming the nails without  dremel tool.   He was very active during footcare last visit.  RTC 3  months.   Helane Gunther DPM

## 2023-12-17 ENCOUNTER — Encounter: Payer: Self-pay | Admitting: *Deleted

## 2024-01-21 ENCOUNTER — Ambulatory Visit: Payer: Medicare HMO | Admitting: Podiatry
